# Patient Record
Sex: Male | Born: 1985 | Race: White | Hispanic: No | Marital: Single | Smoking: Former smoker
Health system: Southern US, Community
[De-identification: ages and names within clinical notes are randomized; demographics above are authoritative.]

## PROBLEM LIST (undated history)

## (undated) ENCOUNTER — Ambulatory Visit (HOSPITAL_COMMUNITY): Admission: EM | Payer: BC Managed Care – PPO | Source: Home / Self Care

## (undated) DIAGNOSIS — F419 Anxiety disorder, unspecified: Secondary | ICD-10-CM

## (undated) DIAGNOSIS — F32A Depression, unspecified: Secondary | ICD-10-CM

## (undated) DIAGNOSIS — F4310 Post-traumatic stress disorder, unspecified: Secondary | ICD-10-CM

## (undated) HISTORY — DX: Anxiety disorder, unspecified: F41.9

## (undated) HISTORY — DX: Post-traumatic stress disorder, unspecified: F43.10

## (undated) HISTORY — PX: HEMILAMINOTOMY LUMBAR SPINE: SUR654

## (undated) HISTORY — DX: Depression, unspecified: F32.A

## (undated) HISTORY — PX: SPINE SURGERY: SHX786

---

## 2017-05-24 ENCOUNTER — Emergency Department (HOSPITAL_COMMUNITY): Payer: 59

## 2017-05-24 ENCOUNTER — Encounter (HOSPITAL_COMMUNITY): Payer: Self-pay

## 2017-05-24 ENCOUNTER — Emergency Department (HOSPITAL_COMMUNITY)
Admission: EM | Admit: 2017-05-24 | Discharge: 2017-05-24 | Disposition: A | Discharge status: Home / Self Care | Payer: 59 | Attending: Emergency Medicine | Admitting: Emergency Medicine

## 2017-05-24 DIAGNOSIS — R55 Syncope and collapse: Secondary | ICD-10-CM | POA: Insufficient documentation

## 2017-05-24 LAB — CBC WITH DIFFERENTIAL/PLATELET
Basophils Absolute: 0.1 10*3/uL (ref 0.0–0.1)
Basophils Relative: 1 %
EOS ABS: 0.4 10*3/uL (ref 0.0–0.7)
EOS PCT: 5 %
HCT: 45.2 % (ref 39.0–52.0)
Hemoglobin: 15.3 g/dL (ref 13.0–17.0)
LYMPHS ABS: 3.6 10*3/uL (ref 0.7–4.0)
Lymphocytes Relative: 43 %
MCH: 28.9 pg (ref 26.0–34.0)
MCHC: 33.8 g/dL (ref 30.0–36.0)
MCV: 85.4 fL (ref 78.0–100.0)
MONO ABS: 0.5 10*3/uL (ref 0.1–1.0)
Monocytes Relative: 7 %
Neutro Abs: 3.8 10*3/uL (ref 1.7–7.7)
Neutrophils Relative %: 44 %
PLATELETS: 233 10*3/uL (ref 150–400)
RBC: 5.29 MIL/uL (ref 4.22–5.81)
RDW: 12.8 % (ref 11.5–15.5)
WBC: 8.3 10*3/uL (ref 4.0–10.5)

## 2017-05-24 LAB — BASIC METABOLIC PANEL
Anion gap: 11 (ref 5–15)
BUN: 12 mg/dL (ref 6–20)
CALCIUM: 8.8 mg/dL — AB (ref 8.9–10.3)
CO2: 21 mmol/L — ABNORMAL LOW (ref 22–32)
CREATININE: 0.99 mg/dL (ref 0.61–1.24)
Chloride: 105 mmol/L (ref 101–111)
GFR calc Af Amer: >60 mL/min (ref >60)
Glucose, Bld: 124 mg/dL — ABNORMAL HIGH (ref 65–99)
POTASSIUM: 3.3 mmol/L — AB (ref 3.5–5.1)
SODIUM: 137 mmol/L (ref 135–145)

## 2017-05-24 MED ORDER — SODIUM CHLORIDE 0.9 % IV BOLUS (SEPSIS)
1000.0000 mL | Freq: Once | INTRAVENOUS | Status: AC
  Administered 2017-05-24: 1000 mL via INTRAVENOUS

## 2017-05-24 MED ORDER — POTASSIUM CHLORIDE CRYS ER 20 MEQ PO TBCR: 40.0000 meq | EXTENDED_RELEASE_TABLET | Freq: Once | ORAL | Status: DC

## 2017-05-24 MED ORDER — ONDANSETRON HCL 4 MG/2ML IJ SOLN: 4.0000 mg | Freq: Once | INTRAMUSCULAR | Status: DC

## 2017-05-24 NOTE — ED Notes (Signed)
The pt feels fine

## 2017-05-24 NOTE — Discharge Instructions (Signed)
Your heart was reportedly racing when EMS got there. Follow-up with cardiology for further monitoring.

## 2017-05-24 NOTE — ED Provider Notes (Signed)
Patient feels well. His lab work is unremarkable except for a mildly low potassium which was repleted the ED. Unclear why his bicarbonate is slightly low but his anion gap is normal. He has been given IV fluids. He is feeling back to normal. Unclear cause of syncope, follow-up with cardiology and PCP per prior plan with Dr. Rubin PayorPickering. Discussed return precautions.   [name], Nicholas Goree, MD 05/24/17 [phone]

## 2017-05-24 NOTE — ED Provider Notes (Signed)
[facility]-EMERGENCY DEPT Provider Note   CSN: [id number] Arrival date & time:        History   Chief Complaint Chief Complaint  Patient presents with  . Seizures    HPI Nicholas Wolfe is a 31 y.o. male.  HPI Patient presents after syncope. Was at work and [employer] when he passed out. 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 Does not remember chest pain or palpitations. Apparently woke up rather quickly and was not confused after. Had some shaking with the episode. No loss of bladder bowel control. No tongue biting. No history of syncope. States he didn't sleep all last night but otherwise has been doing well. States he did not drink much liquid today. No chest pain. No trouble breathing. No swelling in his legs. No recent long traveling. Reported initial heart rate of 150. History reviewed. No pertinent past medical history.  There are no active problems to display for this patient.   History reviewed. No pertinent surgical history.     Home Medications    Prior to Admission medications   Not on File    Family History No family history on file.  Social History Social History  Substance Use Topics  . Smoking status: Not on file  . Smokeless tobacco: Not on file  . Alcohol use Not on file     Allergies   Patient has no allergy information on record.   Review of Systems Review of Systems  Constitutional: Negative for chills and fever.  HENT: Negative for ear discharge, ear pain, hearing loss and tinnitus.   Eyes: Negative for photophobia.  Respiratory: Negative for cough.   Cardiovascular: Positive for palpitations. Negative for chest pain.  Gastrointestinal: Negative for abdominal pain, blood in stool, constipation, diarrhea, nausea and vomiting.  Genitourinary: Negative for dysuria, frequency and urgency.  Musculoskeletal: Negative for myalgias and neck pain.  Skin: Negative for rash.  Neurological: Positive  for syncope. Negative for dizziness, tremors and headaches.  Hematological: Does not bruise/bleed easily.  Psychiatric/Behavioral: Negative for confusion.     Physical Exam Updated Vital Signs BP 133/86   Pulse 100   Temp 97.8 F (36.6 C) (Oral)   Resp 12   SpO2 100%   Physical Exam  Constitutional: He appears well-developed.  HENT:  Head: Normocephalic.  Eyes: Pupils are equal, round, and reactive to light.  Neck: Neck supple.  Cardiovascular: Normal rate.   Pulmonary/Chest: Effort normal.  Abdominal: Soft. There is no tenderness.  Musculoskeletal: He exhibits no edema.  Neurological: He is alert.  Skin: Skin is warm. Capillary refill takes less than 2 seconds.  Psychiatric: He has a normal mood and affect.     ED Treatments / Results  Labs (all labs ordered are listed, but only abnormal results are displayed) Labs Reviewed  CBC WITH DIFFERENTIAL/PLATELET  I-STAT CHEM 8, ED    EKG  EKG Interpretation  Date/Time:  Sunday May 24 2017 15:03:50 EDT Ventricular Rate:  96 PR Interval:    QRS Duration: 112 QT Interval:  358 QTC Calculation: 453 R Axis:   69 Text Interpretation:  Sinus rhythm Borderline intraventricular conduction delay Confirmed by Rubin PayorPICKERING  MD, Harrold DonathNATHAN [phone]([id number]) on 05/24/2017 3:24:15 PM       Radiology No results found.  Procedures Procedures (including critical care time)  Medications Ordered in ED Medications  sodium chloride 0.9 % bolus 1,000 mL (1,000 mLs Intravenous New Bag/Given 05/24/17 1520)  ondansetron (ZOFRAN)  injection 4 mg (not administered)     Initial Impression / Assessment and Plan / ED Course  I have reviewed the triage vital signs and the nursing notes.  Pertinent labs & imaging results that were available during my care of the patient were reviewed by me and considered in my medical decision making (see chart for details).     Patient with syncopal episode. Reportedly had tachycardia. Mild hypotension. Does not  appear to be a seizure. EKG overall reassuring. With the tachycardia will likely follow-up with cardiology. Labs pending and care of returned over to Dr. Criss Alvine  Final Clinical Impressions(s) / ED Diagnoses   Final diagnoses:  Syncope, unspecified syncope type    New Prescriptions New Prescriptions   No medications on file     Benjiman Core, MD 05/24/17 1547

## 2017-05-24 NOTE — ED Triage Notes (Signed)
PER EMS: pt was at work ([employer]) when co-workers saw him fall to the ground from a standing position and started shaking for 30 seconds. No history of seizures. EMS arrived and pt was conscious, alert and oriented. Pt was not post-ictal but "irritated" and no urinary incontinence or oral trauma. Denies headache. He verbalizes no recollection of what happened. He states he feels fine but a little lightheaded. He denies new diet changes and no medications. His fiance states he has been struggling with insomnia but pt reports he slept okay last night. Pt A&OX4. BP: 138/96. His initial HR was 150 with EMS but last BP was 105. 98% RA, CBG-116.

## 2017-05-24 NOTE — ED Notes (Signed)
He feels light headed no pain anywhere   Alert and oriented skin warm and d ry.  fsmily at the bedisde

## 2017-05-29 ENCOUNTER — Encounter: Payer: Self-pay | Admitting: Neurology

## 2017-07-01 ENCOUNTER — Encounter: Payer: Self-pay | Admitting: Neurology

## 2017-07-01 ENCOUNTER — Ambulatory Visit (INDEPENDENT_AMBULATORY_CARE_PROVIDER_SITE_OTHER): Payer: 59 | Admitting: Neurology

## 2017-07-01 VITALS — BP 128/66 | HR 88 | Ht 74.0 in | Wt 208.0 lb

## 2017-07-01 DIAGNOSIS — R251 Tremor, unspecified: Secondary | ICD-10-CM

## 2017-07-01 DIAGNOSIS — R55 Syncope and collapse: Secondary | ICD-10-CM

## 2017-07-01 NOTE — Patient Instructions (Addendum)
1. Schedule MRI brain with and without contrast  We have sent a [facility] to [facility] for your [facility] and they will call you directly to schedule your appt. They are located at [address] If you need to contact them directly please call [phone].   2. Schedule 1-hour sleep-deprived EEG 3. Schedule 2-D echocardiogram 4. As per [place] driving laws, no driving after an episode of loss of consciousness, until 6 months event-free 5. Follow-up in 3 months, call for any changes

## 2017-07-01 NOTE — Progress Notes (Signed)
NEUROLOGY CONSULTATION NOTE  Noreene LarssonGiacchino Endicott MRN: [id number] DOB: February 11, 1986  Referring provider: Manon HildingJessica Mauney, PA Primary care provider: Manon HildingJessica Mauney, PA  Reason for consult:  New onset seizure  Thank you for your kind referral of Kieon Hanlon for consultation of the above symptoms. Although his history is well known to you, please allow me to reiterate it for the purpose of our medical record. Records and images were personally reviewed where available.  HISTORY OF PRESENT ILLNESS: Homero FellersFrank is a very pleasant 31 year old right-handed man with a history of anxiety presenting for evaluation of an episode of loss of consciousness last 05/24/2017. He was at work at [employer] and does not recall having any warning symptoms, he woke up to EMS around him. A co-worker reported that when he fell to the ground, he started having "pretty bad" shaking that lasted for 3 minutes. He recalls waking up with his heart beating fast, per EMS 150 bpm. He denied any tongue bite, urinary incontinence, or focal weakness. [facility] where [facility] indicated he woke up rather quickly and was not confused after. He had not been sleeping well for the past 1.5-2 months and only had 2 hours of sleep the night prior to the episode. His bloodwork was overall unremarkable with minimally low K 3.3, calcium 8.8, CO2 21. EKG showed sinus rhythm, borderlinge intraventricular conduction delay. He was discharged home to follow-up with Cardiology.   He denies any prior similar symptoms. He reports gaps in time since childhood, family have told him he would be in "[place]" for 15 seconds. He has had body twitches since he was younger, he used to have them a lot at night, affecting his legs more, but also has small twitches in the daytime. He has had anxiety since his late teens, sometimes feeling nauseated around once every 1-2 months when anxious. He denies any olfactory/gustatory hallucinations, deja vu, focal  numbness/tingling/weakness. He denies any headaches, dizziness, diplopia, dysarthria/dysphagia, neck/back pain, bowel/bladder dysfunction. No palpitations, chest pain, shortness of breath. He has had 2 concussions in his teenage years from skateboarding, hitting his head on the cement with brief loss of consciousness. Otherwise he had a normal birth and early development.  There is no history of febrile convulsions, CNS infections such as meningitis/encephalitis, significant traumatic brain injury, neurosurgical procedures, or family history of seizures.  PAST MEDICAL HISTORY: History reviewed. No pertinent past medical history.  PAST SURGICAL HISTORY: Past Surgical History:  Procedure Laterality Date  . HEMILAMINOTOMY LUMBAR SPINE      MEDICATIONS: No current outpatient prescriptions on file prior to visit.   No current facility-administered medications on file prior to visit.     ALLERGIES: Not on File  FAMILY HISTORY: No family history on file.  SOCIAL HISTORY: Social History   Social History  . Marital status: Single    Spouse name: N/A  . Number of children: N/A  . Years of education: N/A   Occupational History  . Not on file.   Social History Main Topics  . Smoking status: Not on file  . Smokeless tobacco: Not on file  . Alcohol use Not on file  . Drug use: Unknown  . Sexual activity: Not on file   Other Topics Concern  . Not on file   Social History Narrative  . No narrative on file    REVIEW OF SYSTEMS: Constitutional: No fevers, chills, or sweats, no generalized fatigue, change in appetite Eyes: No visual changes, double vision, eye  pain Ear, nose and throat: No hearing loss, ear pain, nasal congestion, sore throat Cardiovascular: No chest pain, palpitations Respiratory:  No shortness of breath at rest or with exertion, wheezes GastrointestinaI: No nausea, vomiting, diarrhea, abdominal pain, fecal incontinence Genitourinary:  No dysuria, urinary  retention or frequency Musculoskeletal:  No neck pain, back pain Integumentary: No rash, pruritus, skin lesions Neurological: as above Psychiatric: No depression, insomnia, +anxiety Endocrine: No palpitations, fatigue, diaphoresis, mood swings, change in appetite, change in weight, increased thirst Hematologic/Lymphatic:  No anemia, purpura, petechiae. Allergic/Immunologic: no itchy/runny eyes, nasal congestion, recent allergic reactions, rashes  PHYSICAL EXAM: Vitals:   07/01/17 0853  BP: 128/66  Pulse: 88  SpO2: 98%   General: No acute distress Head:  Normocephalic/atraumatic Eyes: Fundoscopic exam shows bilateral sharp discs, no vessel changes, exudates, or hemorrhages Neck: supple, no paraspinal tenderness, full range of motion Back: No paraspinal tenderness Heart: regular rate and rhythm Lungs: Clear to auscultation bilaterally. Vascular: No carotid bruits. Skin/Extremities: No rash, no edema Neurological Exam: Mental status: alert and oriented to person, place, and time, no dysarthria or aphasia, Fund of knowledge is appropriate.  Recent and remote memory are intact. 2/3 delayed recall. Attention and concentration are normal.    Able to name objects and repeat phrases. Cranial nerves: CN I: not tested CN II: pupils equal, round and reactive to light, visual fields intact, fundi unremarkable. CN III, IV, VI:  full range of motion, no nystagmus, no ptosis CN V: facial sensation intact CN VII: upper and lower face symmetric CN VIII: hearing intact to finger rub CN IX, X: gag intact, uvula midline CN XI: sternocleidomastoid and trapezius muscles intact CN XII: tongue midline Bulk & Tone: normal, no fasciculations. Motor: 5/5 throughout with no pronator drift. Sensation: intact to light touch, cold, pin, vibration and joint position sense.  No extinction to double simultaneous stimulation.  Romberg test negative Deep Tendon Reflexes: +2 throughout, no ankle clonus Plantar  responses: downgoing bilaterally Cerebellar: no incoordination on finger to nose, heel to shin. No dysdiadochokinesia Gait: narrow-based and steady, able to tandem walk adequately. Tremor: none  IMPRESSION: This is a very pleasant 31 year old right-handed man with a history of anxiety, presenting after an episode of loss of consciousness with report of shaking. Considerations include seizure versus convulsive syncope. He denies any prior warning symptoms. He was reported to have shaking for 3 minutes, which would be atypical for convulsive syncope. He also reports gaps in time/staring spells and body jerks, which can be seen with primary generalized epilepsy. MRI brain with and without contrast and a 1-hour sleep-deprived EEG will be ordered to assess for focal abnormalities that increase risk for recurrent seizures. He was noted to have a heart rate of 150 bpm after the event, and cardiology follow-up was recommended, an echocardiogram will be ordered as part of syncope workup.  [place] driving laws were discussed with the patient, and he knows to stop driving after an episode of loss of consciousness, until 6 months event-free. Follow-up in 3 months, he knows to call for any changes.   Thank you for allowing me to participate in the care of this patient. Please do not hesitate to call for any questions or concerns.   Patrcia Dolly, M.D.  CC: Manon Hilding, [place]

## 2017-07-10 ENCOUNTER — Ambulatory Visit (HOSPITAL_COMMUNITY): Payer: 59 | Attending: Cardiovascular Disease

## 2017-07-10 ENCOUNTER — Other Ambulatory Visit: Payer: Self-pay

## 2017-07-10 DIAGNOSIS — Z87891 Personal history of nicotine dependence: Secondary | ICD-10-CM | POA: Insufficient documentation

## 2017-07-10 DIAGNOSIS — R55 Syncope and collapse: Secondary | ICD-10-CM | POA: Diagnosis not present

## 2017-07-10 DIAGNOSIS — I071 Rheumatic tricuspid insufficiency: Secondary | ICD-10-CM | POA: Diagnosis not present

## 2017-07-10 DIAGNOSIS — R251 Tremor, unspecified: Secondary | ICD-10-CM | POA: Diagnosis present

## 2017-07-13 ENCOUNTER — Telehealth: Payer: Self-pay

## 2017-07-13 ENCOUNTER — Ambulatory Visit (INDEPENDENT_AMBULATORY_CARE_PROVIDER_SITE_OTHER): Payer: 59 | Admitting: Neurology

## 2017-07-13 DIAGNOSIS — R55 Syncope and collapse: Secondary | ICD-10-CM | POA: Diagnosis not present

## 2017-07-13 DIAGNOSIS — R251 Tremor, unspecified: Secondary | ICD-10-CM

## 2017-07-13 NOTE — Telephone Encounter (Signed)
-----   Message from Van Clines, MD sent at 07/10/2017  3:11 PM EDT ----- Pls let him know the echo was normal, heart looks good, thanks

## 2017-07-13 NOTE — Telephone Encounter (Signed)
Spoke with pt, relaying message below. 

## 2017-07-17 NOTE — Procedures (Signed)
ELECTROENCEPHALOGRAM REPORT  Date of Study: 07/13/2017  Patient's Name: Nicholas Wolfe MRN: [id number] Date of Birth: January 27, 1986  Referring Provider: Dr. Patrcia DollyKaren Aquino  Clinical History: This is a 31 year old man with an episode of loss of consciousness with shaking.  Medications: none  Technical Summary: A multichannel digital 1-hour EEG recording measured by the international 10-20 system with electrodes applied with paste and impedances below 5000 ohms performed in our laboratory with EKG monitoring in an awake and drowsy patient.  Hyperventilation and photic stimulation were performed.  The digital EEG was referentially recorded, reformatted, and digitally filtered in a variety of bipolar and referential montages for optimal display.    Description: The patient is awake and drowsy during the recording.  During maximal wakefulness, there is a symmetric, medium voltage 10 Hz posterior dominant rhythm that attenuates with eye opening.  The record is symmetric.  During drowsiness, there is an increase in theta slowing of the background.  Deeper stages of sleep were not seen.  Hyperventilation and photic stimulation did not elicit any abnormalities.  There were no epileptiform discharges or electrographic seizures seen.    EKG lead was unremarkable.  Impression: This 1-hour awake and drowsy EEG is normal.    Clinical Correlation: A normal EEG does not exclude a clinical diagnosis of epilepsy.  If further clinical questions remain, prolonged EEG may be helpful.  Clinical correlation is advised.   Patrcia DollyKaren Aquino, M.D.

## 2017-07-21 ENCOUNTER — Telehealth: Payer: Self-pay

## 2017-07-21 NOTE — Telephone Encounter (Signed)
LMOM relaying message below.  

## 2017-07-21 NOTE — Telephone Encounter (Signed)
-----   Message from Van ClinesKaren M Aquino, MD sent at 07/21/2017 10:04 AM EDT ----- Pls let him know the brain wave test was normal, thanks

## 2017-10-13 ENCOUNTER — Ambulatory Visit: Payer: 59 | Admitting: Neurology

## 2017-12-09 IMAGING — DX DG CHEST 2V
2 series · 2 of 2 positions shown · non-contrast
Comparison: None.

CLINICAL DATA: Syncope

EXAM:
CHEST  2 VIEW

[chest pa]
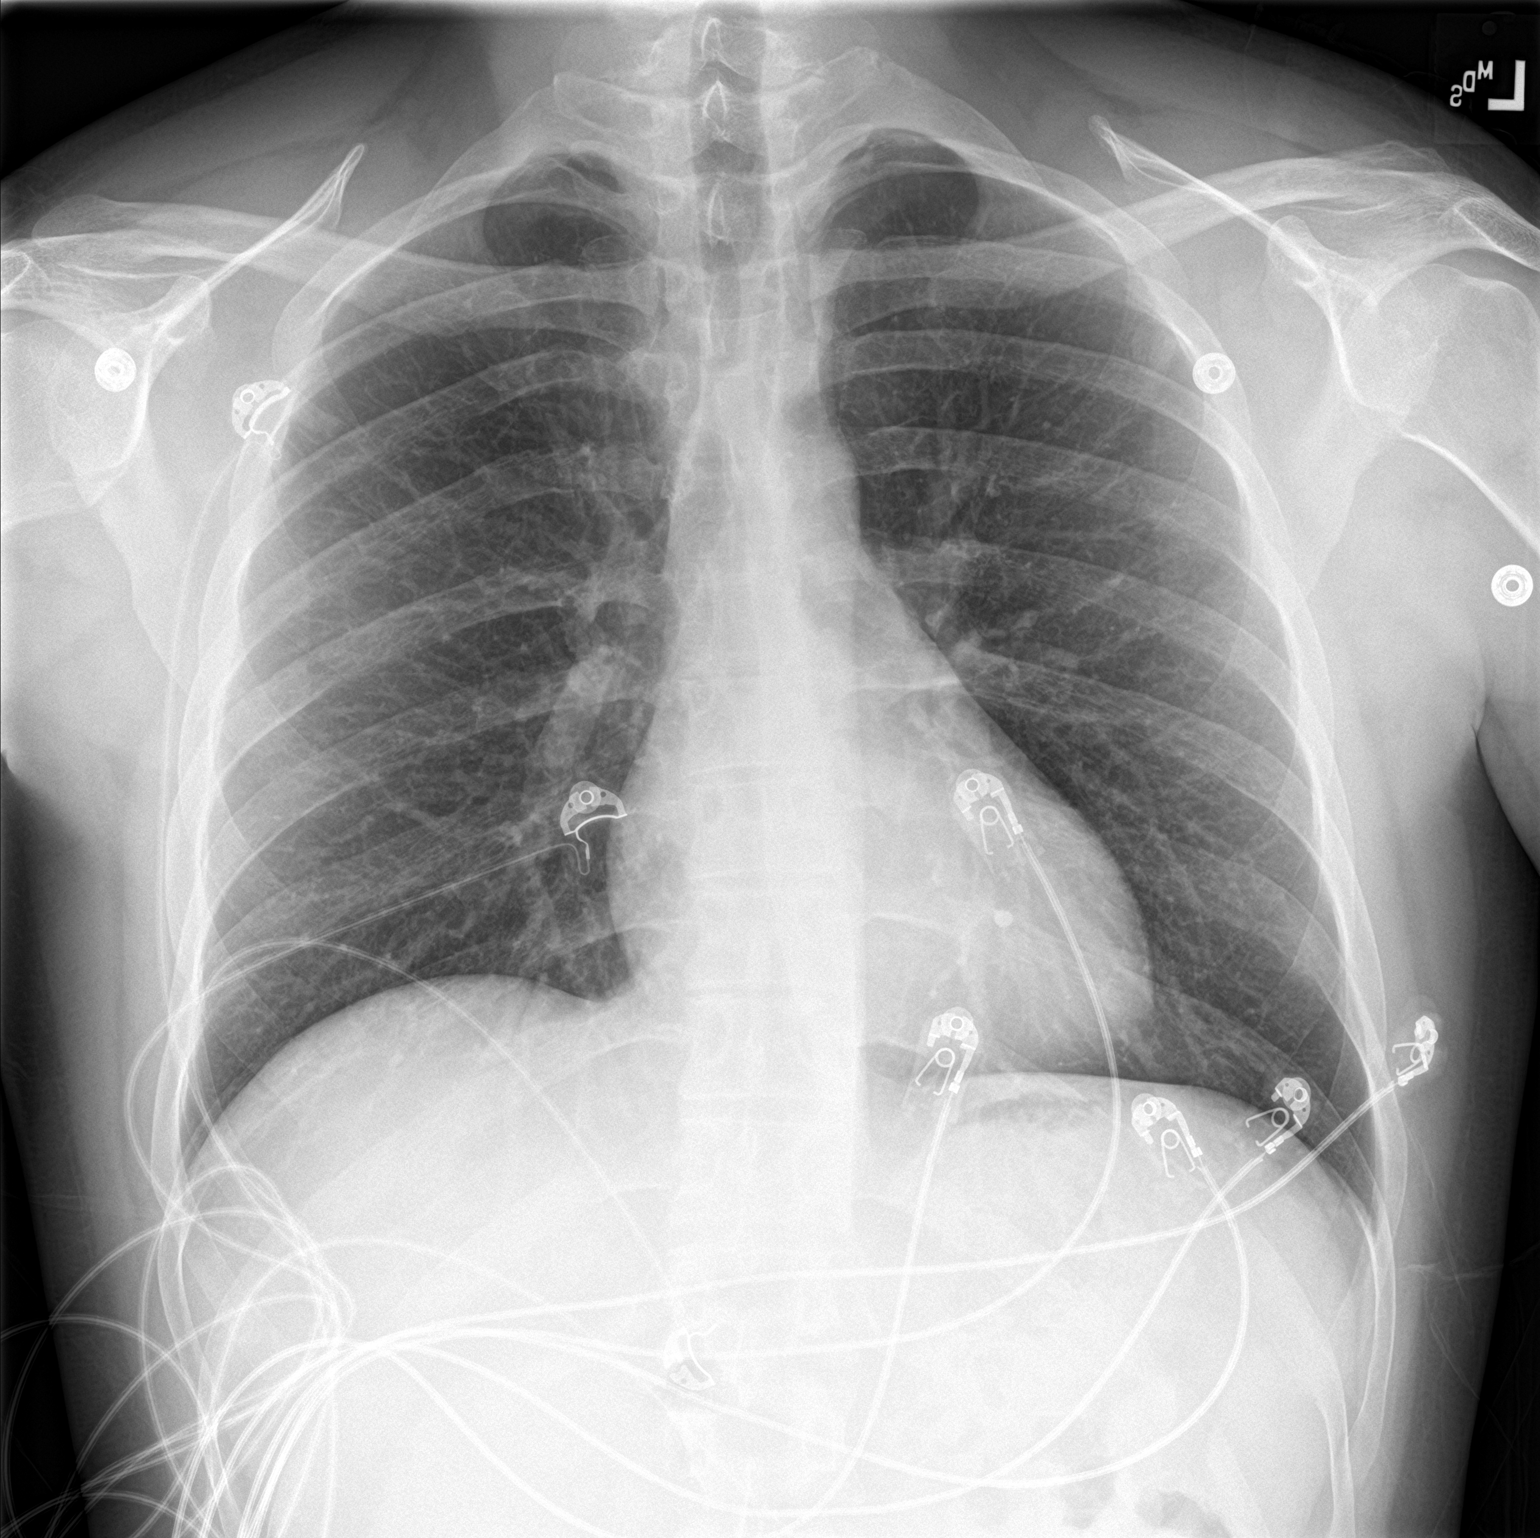

[chest lat]
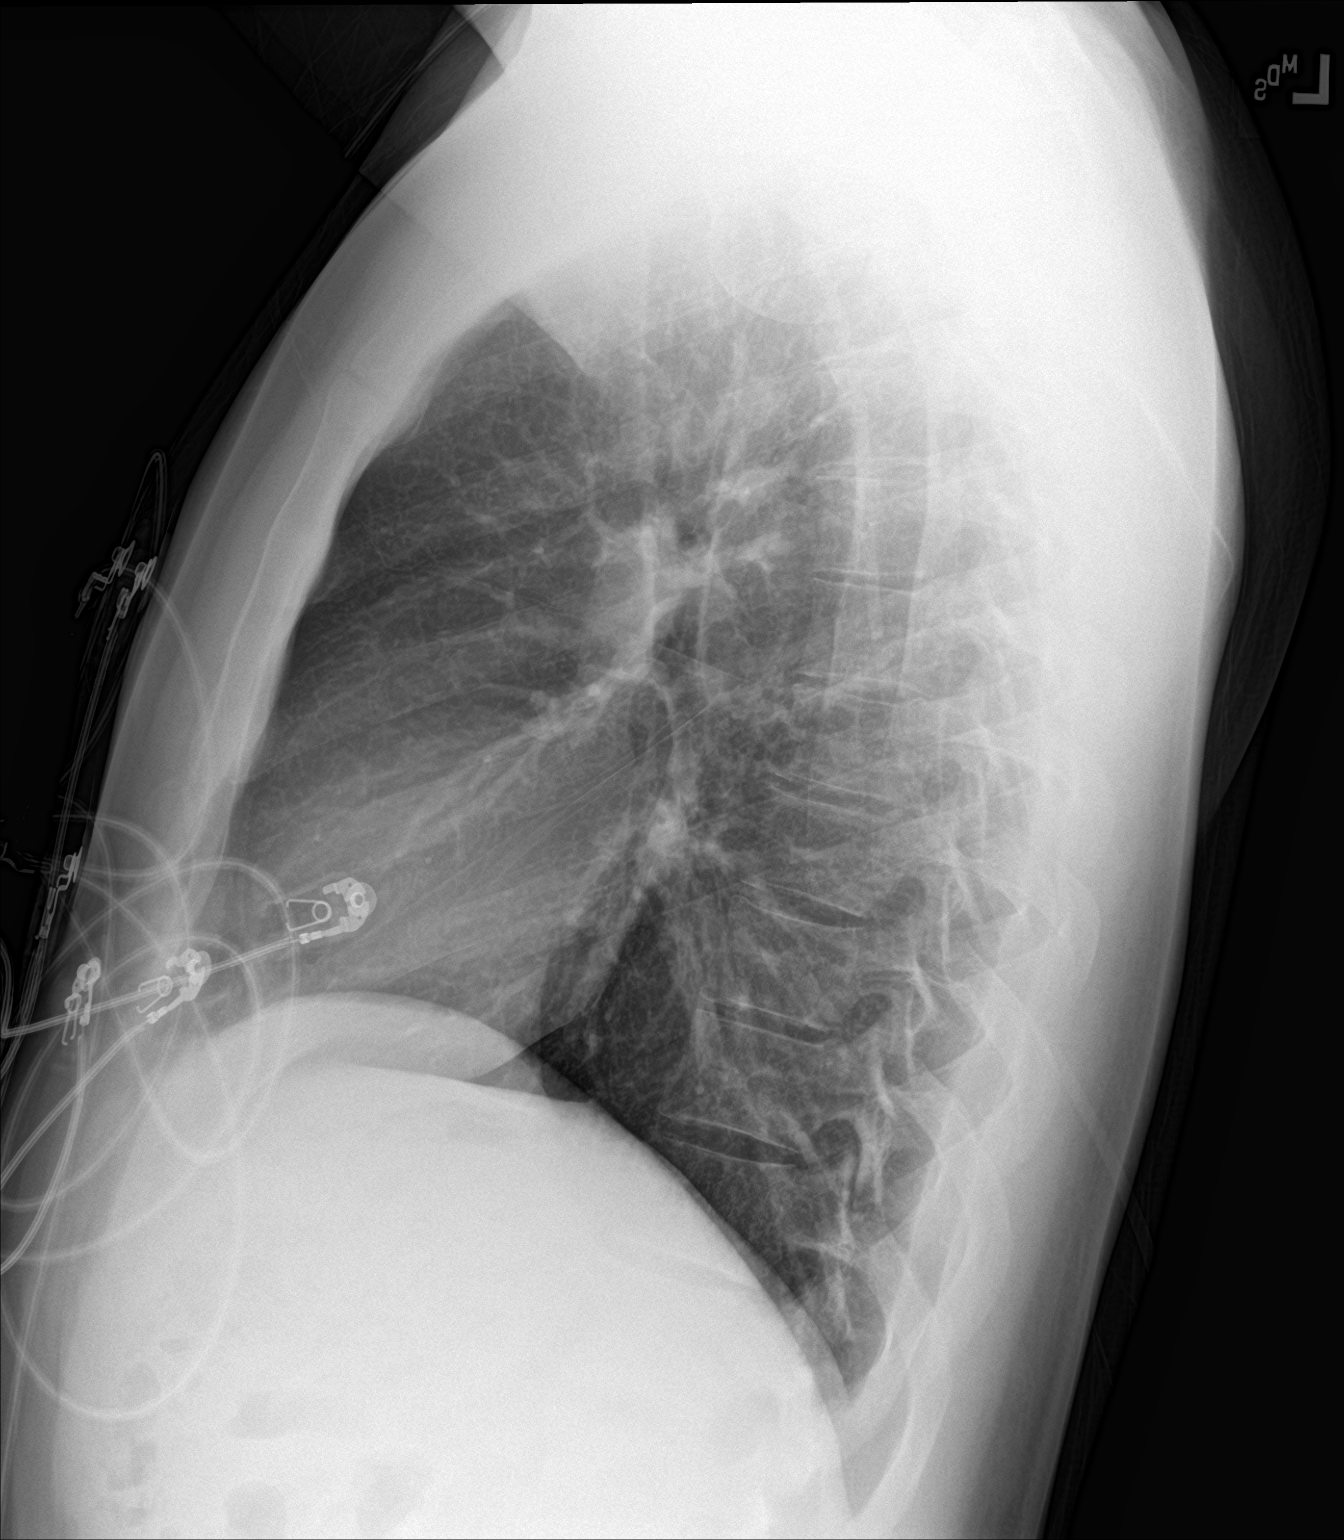

[2 of 2 positions shown; findings below may reference images not displayed]

FINDINGS: Lungs are clear. Heart size and pulmonary vascularity are normal. No
adenopathy. No bone lesions.
IMPRESSION: No edema or consolidation.

## 2021-05-28 ENCOUNTER — Other Ambulatory Visit: Payer: Self-pay

## 2021-05-28 ENCOUNTER — Ambulatory Visit (HOSPITAL_COMMUNITY)
Admission: AD | Admit: 2021-05-28 | Discharge: 2021-05-28 | Disposition: A | Discharge status: Home / Self Care | Payer: BC Managed Care – PPO | Attending: Psychiatry | Admitting: Psychiatry

## 2021-05-28 NOTE — H&P (Signed)
Behavioral Health Medical Screening Exam    Total Time spent with patient: 45 minutes  Nicholas Wolfe is a 35 y.o male, seen by this provider with TTS counselor, presents to [facility] as voluntary walk-in accompanied by no one. Reports "I've been dealing with bad depression and anxiety lately" Reports a source of stress is that he and his wife are getting a divorce and continuing to live together in the same home due to financial issues. Reports frequent panic attacks, approximately  5 per week.   Oriented to person, place, time, and situation. Has good eye contact throughout interview, sitting calmly in exam room. Speech normal, volume normal, thought processes intact. Denies suicidal ideation, without intent or plan. Denies homicidal ideation, denies auditory and visual hallucinations. Recent thoughts of suicide 2 weeks ago after having stressful conversation with his mother. No plan, no intent to die. Able to contract for safety. Does not appear to be responding to external or internal stimuli. Denies past suicide attempts. Denies self injurious  behaviors.   Mood is depressed and anxious with congruent affect. Sleeps 4-5 hours per night, which is normal for patient. Appetite is decreased, "forcing myself to eat".  Denies substance use, endorses using a herbal supplement  that makes him "stoned" and drinks alcohol 2-3 beers per day.   History of depression, anxiety, panic attacks, PTSD. Sees Dr. Evelene Croon, last time last Saturday. Also sees a therapist through his employer.  Currently takes: clonazepam, alprazolam, Depakote. Will start Paxil and stop Depakote. This is a recent change in medications. Lives with wife, Nicholas Wolfe, they are divorcing each other.     Psychiatric Specialty Exam:  Presentation  General Appearance:  Appropriate for Environment Eye Contact: Good Speech: Clear and Coherent Speech Volume: Normal Handedness: Right  Mood and Affect  Mood: Anxious;  Depressed Affect: Congruent  Thought Process  Thought Processes: Goal Directed; Coherent Descriptions of Associations:Intact Orientation:Full (Time, Place and Person) Thought Content:Abstract Reasoning History of Schizophrenia/Schizoaffective disorder:No data recorded Duration of Psychotic Symptoms:No data recorded Hallucinations:Hallucinations: None Ideas of Reference:None Suicidal Thoughts:Suicidal Thoughts: No Homicidal Thoughts:Homicidal Thoughts: No  Sensorium  Memory: Immediate Good; Recent Good; Remote Good Judgment: Good Insight: Good  Executive Functions  Concentration: Good Attention Span: Good Recall: Good Fund of Knowledge: Good Language: Good  Psychomotor Activity  Psychomotor Activity: Psychomotor Activity: Normal  Assets  Assets: Communication Skills; Desire for Improvement; Financial Resources/Insurance; Housing; Physical Health; Vocational/Educational  Sleep  Sleep: Sleep: Fair Number of Hours of Sleep: 5   Physical Exam: Physical Exam Constitutional:      Appearance: Normal appearance.  Cardiovascular:     Rate and Rhythm: Normal rate and regular rhythm.     Heart sounds: Normal heart sounds.  Pulmonary:     Effort: Pulmonary effort is normal.     Breath sounds: Normal breath sounds.  Skin:    General: Skin is warm and dry.  Neurological:     Mental Status: He is alert and oriented to person, place, and time.  Psychiatric:        Attention and Perception: Attention normal.        Mood and Affect: Mood is anxious and depressed.        Speech: Speech normal.        Behavior: Behavior normal. Behavior is cooperative.        Thought Content: Thought content normal. Thought content is not paranoid or delusional. Thought content does not include homicidal or suicidal ideation. Thought content does not include homicidal or suicidal plan.  Cognition and Memory: Cognition normal.     Comments: Suicidal thoughts 2 weeks ago- none  recent   Review of Systems  Constitutional:  Negative for chills and fever.  Respiratory:  Negative for shortness of breath.   Cardiovascular:  Negative for chest pain and palpitations.  Gastrointestinal:  Negative for abdominal pain, nausea and vomiting.  Neurological:  Negative for headaches.  Psychiatric/Behavioral:  Positive for depression. Negative for hallucinations, substance abuse and suicidal ideas. The patient is nervous/anxious and has insomnia (normal for patient).   Blood pressure (!) 139/96, pulse 70, temperature 98.5 F (36.9 C), temperature source Oral, resp. rate 18. There is no height or weight on file to calculate BMI.  Musculoskeletal: Strength & Muscle Tone: within normal limits Gait & Station: normal Patient leans: N/A   Recommendations:  Based on my evaluation the patient does not appear to have an emergency medical condition. Safe for outpatient treatment with resources provided per TTS counselor: Partial hospital program, [facility], and suicide prevention information provided in written form.  Patient agrees with treatment plan.   Novella Olive, NP 05/28/2021, 11:24 AM
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
## 2021-05-30 ENCOUNTER — Telehealth (HOSPITAL_COMMUNITY): Payer: Self-pay | Admitting: Licensed Clinical Social Worker

## 2021-06-17 ENCOUNTER — Telehealth (HOSPITAL_COMMUNITY): Payer: Self-pay | Admitting: Licensed Clinical Social Worker

## 2021-06-20 ENCOUNTER — Other Ambulatory Visit (HOSPITAL_COMMUNITY): Payer: BC Managed Care – PPO | Attending: Psychiatry

## 2021-06-20 ENCOUNTER — Other Ambulatory Visit: Payer: Self-pay

## 2021-06-20 DIAGNOSIS — F4310 Post-traumatic stress disorder, unspecified: Secondary | ICD-10-CM | POA: Insufficient documentation

## 2021-06-20 DIAGNOSIS — R4589 Other symptoms and signs involving emotional state: Secondary | ICD-10-CM | POA: Insufficient documentation

## 2021-06-20 DIAGNOSIS — F332 Major depressive disorder, recurrent severe without psychotic features: Secondary | ICD-10-CM | POA: Insufficient documentation

## 2021-06-20 DIAGNOSIS — R41844 Frontal lobe and executive function deficit: Secondary | ICD-10-CM | POA: Insufficient documentation

## 2021-06-24 ENCOUNTER — Other Ambulatory Visit (HOSPITAL_COMMUNITY): Payer: BC Managed Care – PPO | Admitting: Licensed Clinical Social Worker

## 2021-06-24 ENCOUNTER — Encounter (HOSPITAL_COMMUNITY): Payer: Self-pay

## 2021-06-24 ENCOUNTER — Other Ambulatory Visit: Payer: Self-pay

## 2021-06-24 ENCOUNTER — Other Ambulatory Visit (HOSPITAL_COMMUNITY): Payer: BC Managed Care – PPO | Admitting: Occupational Therapy

## 2021-06-24 DIAGNOSIS — F411 Generalized anxiety disorder: Secondary | ICD-10-CM

## 2021-06-24 DIAGNOSIS — F332 Major depressive disorder, recurrent severe without psychotic features: Secondary | ICD-10-CM | POA: Diagnosis present

## 2021-06-24 DIAGNOSIS — R41844 Frontal lobe and executive function deficit: Secondary | ICD-10-CM

## 2021-06-24 DIAGNOSIS — R4589 Other symptoms and signs involving emotional state: Secondary | ICD-10-CM

## 2021-06-24 DIAGNOSIS — F4310 Post-traumatic stress disorder, unspecified: Secondary | ICD-10-CM | POA: Diagnosis not present

## 2021-06-24 NOTE — Therapy (Signed)
[facility] PARTIAL HOSPITALIZATION PROGRAM [address] [place], [place], [address] Phone: [phone]   Fax:  [phone] Virtual Visit via Video Note  I connected with Nicholas Wolfe on 06/24/21 at  11:00 AM EDT by a video enabled telemedicine application and verified that I am speaking with the correct person using two identifiers.  Location: Patient: Patient Home Provider: Clinic Office   I discussed the limitations of evaluation and management by telemedicine and the availability of in person appointments. The patient expressed understanding and agreed to proceed.   I discussed the assessment and treatment plan with the patient. The patient was provided an opportunity to ask questions and all were answered. The patient agreed with the plan and demonstrated an understanding of the instructions.   The patient was advised to call back or seek an in-person evaluation if the symptoms worsen or if the condition fails to improve as anticipated.  I provided 92 minutes of non-face-to-face time during this encounter. 32 minutes OT Evaluation 60 minutes OT Group  Donne Hazel, OT   Occupational Therapy Evaluation  Patient Details  Name: Nicholas Wolfe MRN: [id number] Date of Birth: [birth year] Referring Provider (OT): Hillery Jacks   Encounter Date: 06/24/2021   OT End of Session - 06/24/21 1313     Visit Number 1    Number of Visits 20    Date for OT Re-Evaluation 07/22/21    Authorization Type BCBS    OT Start Time 1110   OT Eval [phone]   OT Stop Time 1210    OT Time Calculation (min) 60 min    Activity Tolerance Patient tolerated treatment well    Behavior During Therapy Flat affect             Past Medical History:  Diagnosis Date   Anxiety    Depression    PTSD (post-traumatic stress disorder)     Past Surgical History:  Procedure Laterality Date   HEMILAMINOTOMY LUMBAR SPINE     SPINE SURGERY      There were no vitals  filed for this visit.   Subjective Assessment - 06/24/21 1312     Currently in Pain? No/denies               [facility] OT Assessment - 06/24/21 0001       Assessment   Medical Diagnosis Major Depression    Referring Provider (OT) Hillery Jacks      Precautions   Precautions None      Balance Screen   Has the patient fallen in the past 6 months No    Has the patient had a decrease in activity level because of a fear of falling?  No    Is the patient reluctant to leave their home because of a fear of falling?  No             OT Education - 06/24/21 1312     Education Details Educated on OT role within PHP in addition to self-care and provided tips/strategies to improving specific areas of self-care as it relates to one's mental health    Person(s) Educated Patient    Methods Explanation;Handout    Comprehension Verbalized understanding              OT Short Term Goals - 06/24/21 1314       OT SHORT TERM GOAL #1   Title Pt will actively engage in OT group sessions throughout duration of PHP programming, in order to  promote daily structure, social engagement, and opportunities to develop and utilize adaptive strategies to maximize functional performance in preparation for safe transition and integration back into school, work, and the community.    Time 4    Period Weeks    Status New    Target Date 07/22/21      OT SHORT TERM GOAL #2   Title Pt will demonstrate improved ability to communicate feelings/needs/wants, without being angry/irritable/aggressive, as evidenced by, active participation in OT sessions, throughout duration of PHP programming, in order to safely transition back into the community at discharge.    Time 4    Period Weeks    Status New    Target Date 07/22/21      OT SHORT TERM GOAL #3   Title Pt will identify 1-3 strategies to increase social participation, in order to promote healthy socialization and community reintegration, in preparation  for discharge.    Time 4    Period Weeks    Status New    Target Date 07/22/21      OT SHORT TERM GOAL #4   Title Pt will identify and implement 1-3 sleep hygiene strategies he can utilize, in order to improve sleep quality/ADL performance, in preparation for safe and healthy reintegration back into the community at discharge.    Time 4    Period Weeks    Status New    Target Date 07/22/21           Occupational Therapy Assessment 06/24/2021  Nicholas "Nicholas Fellers" is a 35 y/o male with PMHx of depression and anxiety who was referred to the [facility] program as a walk-in from [facility] with reports of worsening depression and anxiety over the last 3 months in the context of his wife asking for a divorce. Pt and ex-wife are currently living together with a plan to sell the house in October, reports this is an added stressors. Pt also reports financial difficulties, reportedly having 0.14 in his bank account. Pt also reports being on leave from work, but "assuming" he is fired, as he has not showed up in the last two months. Pt reports difficulty engaging in ADL/iADLs, not having showered or changed his clothes over a 3 week period. Pt reports a desire to engage in PHP programming in order to manage identified stressors and to engage meaningfully in identified areas of occupation and ADL/iADLs. Upon approach, pt presents as cooperative and forthcoming with information, though notably depressed with flat affect. Pt reports enjoying kayaking, watching TV, bass fishing, and playing video games.  Precautions/Limitations: None noted/observed  Cognition: WFL   Visual Motor: WFL   Living Situation: Pt lives in a house with his ex-wife; currently going through a divorce  School/Work: Pt works FT as a Biomedical scientist for [employer] - has not been to work in 2 months and reports he is 'probably' fired  ADL/iADL Performance: Reports difficulty engaging in ADL/iADLs; 3 week period where he did not shower/change his  clothes; reports ongoing difficulty with sleep, hygiene, and appetite   Leisure Interests and Hobbies: Enjoys kayaking, video games, watching TV, bass fishing  Social Support: Mom and sister (both in [place]) ; reports no friends here in [place]   What do you do when you are very stressed, angry, upset, sad or anxious? Isolate from others, Use drugs/alcohol, Yell/Scream, Cry, and Act out   What helps when you are not feeling well? Deep breathing, Watching TV, and Pacing the hall  What are some things that  make it MORE difficult for you when you are already upset? Not having choices/input, Noise (in general), Bedroom door being open, Not being able to express my opinion, People staring at me, and Being criticized  Is there anything specific that you would like help with while you're in the partial hospitalization program? Coping Skills, Anger Management, Impulse Control, Relationships, Stress Management, Goal-setting, Sleep, and Self-esteem   What is your goal while you are here?  None noted*  Assessment: Pt demonstrates behavior that inhibits/restricts participation in occupation and would benefit from skilled occupational therapy services to address current difficulties with symptom management, emotion regulation, socialization, stress management, time management, job readiness, financial wellness, health and nutrition, sleep hygiene, ADL/iADL performance and leisure participation, in preparation for reintegration and return to community at discharge.   Plan: Pt will participate in skilled occupational therapy sessions (group and/or individual) in order to promote daily structure, social engagement, and opportunities to develop and utilize adaptive strategies to maximize functional performance in preparation for safe transition and integration back into school, work, and/or the community at discharge. OT sessions will occur 4-5 x per week for 2-4 weeks.   Donne Hazelassidy Jaquell Seddon, MOT, OTR/L  Group  Session:  S: "I think I know about eating balanced meals, but right now I am struggling with not having much of an appetite to eat anything."  O: Today's group session focused on the topic of self-care and group member completed a self-care assessment that identified specific categories within self-care that needed improvement, including physical, emotional/psychological, social, spiritual, and professional. Discussion focused on identifying which areas need the most work/improvement and brainstormed strategies and tips to improve self-care. Group members were also encouraged to identify strengths and areas of improvement.     A:  Nicholas Wolfe was active in his participation of discussion and activity, sharing that self-care is engaging in activities that he enjoys, such as video games and kayaking. Pt identified several areas/activities of self-care strength including eating balanced meals, engaging in hygiene, getting away from distractions, finding reasons to laugh, and talking about his problems. Pt identified areas of improvement as having low appetite, exercise, getting more sleep, engaging in fun activities, going to medical appointments, engaging in hobbies, and overall social self-care. Pt receptive to feedback, education, and support offered during session.     P: Continue to attend PHP OT group sessions 5x week for 4 weeks to promote daily structure, social engagement, and opportunities to develop and utilize adaptive strategies to maximize functional performance in preparation for safe transition and integration back into school, work, and the community. Plan to address topic of stress management in next OT group session.  Plan - 06/24/21 1313     Clinical Impression Statement Osamu "Nicholas Wolfe" is a 35 y/o male with PMHx of depression and anxiety [facility] with reports of worsening depression and anxiety over the last 3 months in the context of his wife  asking for a divorce. Pt and ex-wife are currently living together with a plan to sell the house in October, reports this is an added stressors. Pt also reports financial difficulties, reportedly having 0.14 in his bank account. Pt also reports being on leave from work, but "assuming" he is fired, as he has not showed up in the last two months. Pt reports difficulty engaging in ADL/iADLs, not having showered or changed his clothes over a 3 week period. [facility]  order to manage identified stressors and to engage meaningfully in identified areas of occupation and ADL/iADLs.    OT Occupational Profile and History Problem Focused Assessment - Including review of records relating to presenting problem    Occupational performance deficits (Please refer to evaluation for details): ADL's;IADL's;Rest and Sleep;Work;Leisure;Social Participation    Body Structure / Function / Physical Skills ADL    Cognitive Skills Attention;Emotional;Energy/Drive;Learn;Memory;Perception;Problem Solve;Safety Awareness;Temperament/Personality;Thought;Understand    Psychosocial Skills Coping Strategies;Environmental  Adaptations;Habits;Interpersonal Interaction;Routines and Behaviors    Rehab Potential Good    Clinical Decision Making Limited treatment options, no task modification necessary    Comorbidities Affecting Occupational Performance: May have comorbidities impacting occupational performance    Modification or Assistance to Complete Evaluation  No modification of tasks or assist necessary to complete eval    OT Frequency 5x / week    OT Duration 4 weeks    OT Treatment/Interventions Self-care/ADL training;Patient/family education;Coping strategies training;Psychosocial skills training    Consulted and Agree with Plan of Care Patient             Patient will benefit from skilled therapeutic intervention in order to improve the following deficits and impairments:   Body  Structure / Function / Physical Skills: ADL Cognitive Skills: Attention, Emotional, Energy/Drive, Learn, Memory, Perception, Problem Solve, Safety Awareness, Temperament/Personality, Thought, Understand Psychosocial Skills: Coping Strategies, Environmental  Adaptations, Habits, Interpersonal Interaction, Routines and Behaviors   Visit Diagnosis: Difficulty coping  Frontal lobe and executive function deficit  Severe episode of recurrent major depressive disorder, without psychotic features (HCC)    Problem List There are no problems to display for this patient.   06/24/2021  Donne Hazel, MOT, OTR/L  06/24/2021, 1:16 PM  [facility] HOSPITALIZATION PROGRAM [address] [place], [place], [address] Phone: [phone]   Fax:  [phone]  Name: Aasir Daigler MRN: [id number] Date of Birth: 12-26-1985

## 2021-06-24 NOTE — Progress Notes (Signed)
Spoke with patient via Webex video call, used 2 identifiers to correctly identify patient. This is his first time in [facility] after going to [facility] for an evaluation he was recommended for the program. Dealing with depression and anxiety, panic attacks daily. Was having suicidal thoughts when he went for evaluation but no plan or intent and knew he needed help. Has never attempted suicide. Has extreme mental fatigue. His wife has filed for divorce 3 months ago but they continue to live together due to financial constraints. He would like to work things out but she is unwilling. He recently lost his job due to absences surrounding his mental health. Was suppose to switch from Depakote to Paxil but can't afford the medication. States it cost $60 monthly and he has change in his account. Denies SI/HI or AV hallucinations. On scale 1-10 as 10 being worst he rates depression at 7 and anxiety at 8. PHQ9=22. No other issues or complaints.

## 2021-06-25 ENCOUNTER — Other Ambulatory Visit (HOSPITAL_COMMUNITY): Payer: BC Managed Care – PPO

## 2021-06-25 ENCOUNTER — Other Ambulatory Visit: Payer: Self-pay

## 2021-06-25 ENCOUNTER — Telehealth (HOSPITAL_COMMUNITY): Payer: Self-pay | Admitting: Licensed Clinical Social Worker

## 2021-06-26 ENCOUNTER — Other Ambulatory Visit (HOSPITAL_COMMUNITY): Payer: BC Managed Care – PPO

## 2021-06-26 ENCOUNTER — Telehealth (HOSPITAL_COMMUNITY): Payer: Self-pay | Admitting: Licensed Clinical Social Worker

## 2021-06-26 ENCOUNTER — Other Ambulatory Visit: Payer: Self-pay

## 2021-06-27 ENCOUNTER — Other Ambulatory Visit: Payer: Self-pay

## 2021-06-27 ENCOUNTER — Other Ambulatory Visit (HOSPITAL_COMMUNITY): Payer: BC Managed Care – PPO | Admitting: Licensed Clinical Social Worker

## 2021-06-27 ENCOUNTER — Other Ambulatory Visit (HOSPITAL_COMMUNITY): Payer: BC Managed Care – PPO | Admitting: Occupational Therapy

## 2021-06-27 DIAGNOSIS — F332 Major depressive disorder, recurrent severe without psychotic features: Secondary | ICD-10-CM

## 2021-06-27 DIAGNOSIS — R41844 Frontal lobe and executive function deficit: Secondary | ICD-10-CM

## 2021-06-27 DIAGNOSIS — F411 Generalized anxiety disorder: Secondary | ICD-10-CM

## 2021-06-27 DIAGNOSIS — R4589 Other symptoms and signs involving emotional state: Secondary | ICD-10-CM

## 2021-06-28 ENCOUNTER — Encounter (HOSPITAL_COMMUNITY): Payer: Self-pay

## 2021-06-28 ENCOUNTER — Other Ambulatory Visit: Payer: Self-pay

## 2021-06-28 ENCOUNTER — Other Ambulatory Visit (HOSPITAL_COMMUNITY): Payer: BC Managed Care – PPO | Admitting: Licensed Clinical Social Worker

## 2021-06-28 ENCOUNTER — Other Ambulatory Visit (HOSPITAL_COMMUNITY): Payer: BC Managed Care – PPO | Admitting: Occupational Therapy

## 2021-06-28 DIAGNOSIS — R4589 Other symptoms and signs involving emotional state: Secondary | ICD-10-CM | POA: Diagnosis not present

## 2021-06-28 DIAGNOSIS — F332 Major depressive disorder, recurrent severe without psychotic features: Secondary | ICD-10-CM

## 2021-06-28 DIAGNOSIS — F411 Generalized anxiety disorder: Secondary | ICD-10-CM

## 2021-06-28 DIAGNOSIS — R41844 Frontal lobe and executive function deficit: Secondary | ICD-10-CM

## 2021-06-28 NOTE — Therapy (Signed)
[facility] [place], [place], [address] Phone: [phone]   Fax:  [phone] Virtual Visit via Video Note  I connected with Nicholas Wolfe on 06/27/21 at  12:00 PM EDT by a video enabled telemedicine application and verified that I am speaking with the correct person using two identifiers.  Location: Patient: Patient Home Provider: Clinic Office    I discussed the limitations of evaluation and management by telemedicine and the availability of in person appointments. The patient expressed understanding and agreed to proceed.   I discussed the assessment and treatment plan with the patient. The patient was provided an opportunity to ask questions and all were answered. The patient agreed with the plan and demonstrated an understanding of the instructions.   The patient was advised to call back or seek an in-person evaluation if the symptoms worsen or if the condition fails to improve as anticipated.  I provided 50 minutes of non-face-to-face time during this encounter.   Ponciano Ort, OT   Occupational Therapy Treatment  Patient Details  Name: Nicholas Wolfe MRN: [id number] Date of Birth: [birth year] Referring Provider (OT): Ricky Ala   Encounter Date: 06/27/2021   OT End of Session - 06/28/21 0754     Visit Number 2    Number of Visits 20    Date for OT Re-Evaluation 07/22/21    Authorization Type BCBS Deduct [id number] 0 met   Co Ins 30%   OOP [phone] met   Visit based on medical necessity Visit limit 30- 0 used PT/OT/ ST combined   S/w Marga Melnick [phone]    Authorization - Number of Visits 30    OT Start Time 1200    OT Stop Time 1250    OT Time Calculation (min) 50 min    Activity Tolerance Patient tolerated treatment well    Behavior During Therapy WFL for tasks assessed/performed             Past Medical History:  Diagnosis Date   Anxiety    Depression    PTSD  (post-traumatic stress disorder)     Past Surgical History:  Procedure Laterality Date   HEMILAMINOTOMY LUMBAR SPINE     SPINE SURGERY      There were no vitals filed for this visit.   Subjective Assessment - 06/28/21 0753     Currently in Pain? No/denies                                  OT Education - 06/28/21 0753     Education Details Educated on different communication styles and identified strategies/tips to practice being more assertive    Person(s) Educated Patient    Methods Explanation;Handout    Comprehension Verbalized understanding              OT Short Term Goals - 06/28/21 0755       OT SHORT TERM GOAL #1   Status On-going      OT SHORT TERM GOAL #2   Status On-going      OT SHORT TERM GOAL #3   Status On-going      OT SHORT TERM GOAL #4   Status On-going           Group Session:  S: "I struggle with other people misinterpreting my communication. I'm a loud 48'2" male and people, in general, find that intimidating, even if I am  not upset."  O: Today's group focused on topic of Communication Styles. Group members were educated on the different styles including passive, aggressive, and assertive communication. Members shared and reflected on which style they most often find themselves communicating in and how to transition to a more assertive approach.   A: [name] was active and independent in his participation of discussion and shared how he struggles with others misinterpreting his communication, noting that people often tell him he is intimidating. Pt shared that this makes it difficult for him to be assertive and speak up for himself at times because other people feel "attacked" and report his loud nature as "aggressive." Pt appeared open to feedback and additional education reviewing communication styles/tactics.   P: Continue to attend PHP OT group sessions 5x week for 3 weeks to promote daily structure, social  engagement, and opportunities to develop and utilize adaptive strategies to maximize functional performance in preparation for safe transition and integration back into school, work, and the community. Plan to address topic of assertiveness in next OT group session.  Plan - 06/28/21 0755     Occupational performance deficits (Please refer to evaluation for details): ADL's;IADL's;Rest and Sleep;Work;Leisure;Social Participation    Body Structure / Function / Physical Skills ADL    Cognitive Skills Attention;Emotional;Energy/Drive;Learn;Memory;Perception;Problem Solve;Safety Awareness;Temperament/Personality;Thought;Understand    Psychosocial Skills Coping Strategies;Environmental  Adaptations;Habits;Interpersonal Interaction;Routines and Behaviors             Patient will benefit from skilled therapeutic intervention in order to improve the following deficits and impairments:   Body Structure / Function / Physical Skills: ADL Cognitive Skills: Attention, Emotional, Energy/Drive, Learn, Memory, Perception, Problem Solve, Safety Awareness, Temperament/Personality, Thought, Understand Psychosocial Skills: Coping Strategies, Environmental  Adaptations, Habits, Interpersonal Interaction, Routines and Behaviors   Visit Diagnosis: Difficulty coping  Frontal lobe and executive function deficit  Severe episode of recurrent major depressive disorder, without psychotic features ([place])    Problem List There are no problems to display for this patient.   06/28/2021  Ponciano Ort, MOT, OTR/L  06/28/2021, 7:56 AM  [facility] [place], [place], [address] Phone: [phone]   Fax:  [phone]  Name: Nicholas Wolfe MRN: [id number] Date of Birth: 07/19/1986

## 2021-06-28 NOTE — Therapy (Addendum)
[place], [place], [address] Phone: [phone]   Fax:  [phone] Virtual Visit via Video Note  I connected with Nicholas Wolfe on 06/28/21 at  12:00 PM EDT by a video enabled telemedicine application and verified that I am speaking with the correct person using two identifiers.  Location: Patient: Patient Home Provider: Clinic Office   I discussed the limitations of evaluation and management by telemedicine and the availability of in person appointments. The patient expressed understanding and agreed to proceed.  I discussed the assessment and treatment plan with the patient. The patient was provided an opportunity to ask questions and all were answered. The patient agreed with the plan and demonstrated an understanding of the instructions.   The patient was advised to call back or seek an in-person evaluation if the symptoms worsen or if the condition fails to improve as anticipated.  I provided 50 minutes of non-face-to-face time during this encounter.   Nicholas Wolfe, OT   Occupational Therapy Treatment  Patient Details  Name: Nicholas Wolfe MRN: [id number] Date of Birth: 25-May-1986 Referring Provider (OT): Nicholas Wolfe   Encounter Date: 06/28/2021   OT End of Session - 06/28/21 1451     Visit Number 3    Number of Visits 20    Date for OT Re-Evaluation 07/22/21    Authorization Type BCBS Deduct 3500- 0 met   Co Ins 30%   OOP 5945-859.29 met   Visit based on medical necessity Visit limit 30- 0 used PT/OT/ ST combined   S/w Nicholas Wolfe [phone]    Authorization - Number of Visits 30    OT Start Time 1200    OT Stop Time 1250    OT Time Calculation (min) 50 min    Activity Tolerance Patient tolerated treatment well    Behavior During Therapy WFL for tasks assessed/performed             Past Medical History:  Diagnosis Date   Anxiety    Depression    PTSD  (post-traumatic stress disorder)     Past Surgical History:  Procedure Laterality Date   HEMILAMINOTOMY LUMBAR SPINE     SPINE SURGERY      There were no vitals filed for this visit.   Subjective Assessment - 06/28/21 1451     Currently in Pain? No/denies                                  OT Education - 06/28/21 1451     Education Details Educated on different communication styles with strategies to become more assertive with use of XYZ communication tool    Person(s) Educated Patient    Methods Explanation;Handout    Comprehension Verbalized understanding              OT Short Term Goals - 06/28/21 0755       OT SHORT TERM GOAL #1   Status On-going      OT SHORT TERM GOAL #2   Status On-going      OT SHORT TERM GOAL #3   Status On-going      OT SHORT TERM GOAL #4   Status On-going           Group Session:  S: "My ex wife is a passive aggressive communicator and that sets me off every time to become aggressive."  O: Group began  with a reflection from previous OT session focused on communication styles and group members re-iterated what was learned during previous session. Members shared and reflected on any opportunities they were presented with last evening to practice their assertiveness skills or recognize patterns of communication observed. Today's group focused on assertiveness skills training and use of the XYZ* assertive communication tool was introduced. The XYZ communication tool states: I feel X when you do Y in situation Z and I would like _________. X is the emotion, Y is the specific behavior, and Z is the specific situation. Group members each formulated their own XYZ statement and shared with the group to discuss and offer feedback. Additional tips and strategies to practice being assertive were also introduced and discussed.   A: Nicholas Wolfe was active and independent in his participation of discussion and activity, sharing that  he has the most difficulty conversing with his ex-wife, as she is a passive aggressive communicator and he is aggressive. Pt appeared open and receptive to use of XYZ formula and expressed interest in utilizing assertive tips reviewed in the future.  P: Continue to attend PHP OT group sessions 5x week for 4 weeks to promote daily structure, social engagement, and opportunities to develop and utilize adaptive strategies to maximize functional performance in preparation for safe transition and integration back into school, work, and the community. Plan to address topic of fair fighting in next OT group session.  OCCUPATIONAL THERAPY DISCHARGE SUMMARY  Visits from Start of Care: 3  Current functional level related to goals / functional outcomes: Nicholas Wolfe has not achieved any of his OT goals within the [facility] program and is being discharged at this time due to not returning since the last visit. Recommend patient continue with PCP/OP provider for ongoing therapy and treatment needs.    Remaining deficits: See above    Education / Equipment: See above    Patient agrees to discharge. Patient goals were not met. Patient is being discharged due to not returning since the last visit..     Plan - 06/28/21 1454     Occupational performance deficits (Please refer to evaluation for details): ADL's;IADL's;Rest and Sleep;Work;Leisure;Social Participation    Body Structure / Function / Physical Skills ADL    Cognitive Skills Attention;Emotional;Energy/Drive;Learn;Memory;Perception;Problem Solve;Safety Awareness;Temperament/Personality;Thought;Understand    Psychosocial Skills Coping Strategies;Environmental  Adaptations;Habits;Interpersonal Interaction;Routines and Behaviors             Patient will benefit from skilled therapeutic intervention in order to improve the following deficits and impairments:   Body Structure / Function / Physical Skills: ADL Cognitive Skills: Attention, Emotional,  Energy/Drive, Learn, Memory, Perception, Problem Solve, Safety Awareness, Temperament/Personality, Thought, Understand Psychosocial Skills: Coping Strategies, Environmental  Adaptations, Habits, Interpersonal Interaction, Routines and Behaviors   Visit Diagnosis: Difficulty coping  Frontal lobe and executive function deficit  Severe episode of recurrent major depressive disorder, without psychotic features ([place])    Problem List There are no problems to display for this patient.   06/28/2021  Nicholas Wolfe, MOT, OTR/L  06/28/2021, 2:54 PM  [facility] HOSPITALIZATION PROGRAM [place], [place], [address] Phone: [phone]   Fax:  [phone]  Name: Venancio Chenier MRN: [id number] Date of Birth: 08/06/1986

## 2021-07-01 ENCOUNTER — Other Ambulatory Visit (HOSPITAL_COMMUNITY): Payer: BC Managed Care – PPO

## 2021-07-01 ENCOUNTER — Other Ambulatory Visit: Payer: Self-pay

## 2021-07-01 ENCOUNTER — Telehealth (HOSPITAL_COMMUNITY): Payer: Self-pay | Admitting: Licensed Clinical Social Worker

## 2021-07-02 ENCOUNTER — Telehealth (HOSPITAL_COMMUNITY): Payer: Self-pay | Admitting: Licensed Clinical Social Worker

## 2021-07-02 ENCOUNTER — Other Ambulatory Visit: Payer: Self-pay

## 2021-07-02 ENCOUNTER — Other Ambulatory Visit (HOSPITAL_COMMUNITY): Payer: BC Managed Care – PPO

## 2021-07-03 ENCOUNTER — Other Ambulatory Visit: Payer: Self-pay

## 2021-07-03 ENCOUNTER — Other Ambulatory Visit (HOSPITAL_COMMUNITY): Payer: BC Managed Care – PPO

## 2021-07-03 ENCOUNTER — Telehealth (HOSPITAL_COMMUNITY): Payer: Self-pay | Admitting: Licensed Clinical Social Worker

## 2021-07-04 ENCOUNTER — Other Ambulatory Visit: Payer: Self-pay

## 2021-07-04 ENCOUNTER — Other Ambulatory Visit (HOSPITAL_COMMUNITY): Payer: BC Managed Care – PPO

## 2021-07-05 ENCOUNTER — Other Ambulatory Visit (HOSPITAL_COMMUNITY): Payer: BC Managed Care – PPO

## 2021-07-05 ENCOUNTER — Other Ambulatory Visit: Payer: Self-pay

## 2021-07-05 ENCOUNTER — Telehealth (HOSPITAL_COMMUNITY): Payer: Self-pay | Admitting: Licensed Clinical Social Worker

## 2021-07-08 ENCOUNTER — Other Ambulatory Visit (HOSPITAL_COMMUNITY): Payer: BC Managed Care – PPO

## 2021-07-08 ENCOUNTER — Other Ambulatory Visit: Payer: Self-pay

## 2021-07-09 ENCOUNTER — Other Ambulatory Visit: Payer: Self-pay

## 2021-07-09 ENCOUNTER — Other Ambulatory Visit (HOSPITAL_COMMUNITY): Payer: BC Managed Care – PPO

## 2021-07-10 ENCOUNTER — Telehealth (HOSPITAL_COMMUNITY): Payer: Self-pay | Admitting: Licensed Clinical Social Worker

## 2021-07-10 ENCOUNTER — Other Ambulatory Visit (HOSPITAL_COMMUNITY): Payer: BC Managed Care – PPO

## 2021-07-10 ENCOUNTER — Other Ambulatory Visit: Payer: Self-pay

## 2021-07-11 ENCOUNTER — Other Ambulatory Visit (HOSPITAL_COMMUNITY): Payer: BC Managed Care – PPO

## 2021-07-11 ENCOUNTER — Ambulatory Visit (HOSPITAL_COMMUNITY): Payer: BC Managed Care – PPO

## 2021-07-12 ENCOUNTER — Ambulatory Visit (HOSPITAL_COMMUNITY): Payer: BC Managed Care – PPO

## 2021-07-12 ENCOUNTER — Other Ambulatory Visit (HOSPITAL_COMMUNITY): Payer: BC Managed Care – PPO

## 2021-08-08 NOTE — Psych (Signed)
Virtual Visit via Video Note  I connected with Nicholas Wolfe on 06/24/21 at  9:00 AM EDT by a video enabled telemedicine application and verified that I am speaking with the correct person using two identifiers.  Location: Patient: patient home Provider: clinical home office   I discussed the limitations of evaluation and management by telemedicine and the availability of in person appointments. The patient expressed understanding and agreed to proceed.  I discussed the assessment and treatment plan with the patient. The patient was provided an opportunity to ask questions and all were answered. The patient agreed with the plan and demonstrated an understanding of the instructions.   The patient was advised to call back or seek an in-person evaluation if the symptoms worsen or if the condition fails to improve as anticipated.  Pt was provided 240 minutes of non-face-to-face time during this encounter.   [name], LCSW    [facility] PHP THERAPIST PROGRESS NOTE  Wandell Scullion [id number]  Session Time: 9:00 - 10:00  Participation Level: Active  Behavioral Response: CasualAlertAnxious and Depressed  Type of Therapy: Group Therapy  Treatment Goals addressed: Coping  Interventions: CBT, DBT, Supportive, and Reframing  Summary: Clinician led check-in regarding current stressors and situation. Clinician utilized active listening and empathetic response and validated patient emotions. Clinician facilitated processing group on pertinent issues.   Therapist Response: Nicholas Wolfe is a 35 y.o. male who presents with depression and anxiety symptoms. Patient arrived within time allowed and reports that he is feeling "very, very tired." Patient rates his mood at a 4 on a scale of 1-10 with 10 being great. Pt reports he had an argument with his wife over the weekend and had been ruminating since. Pt reports high stress, anxiety, and depression at an 8/10 scale. Pt reports  concerns regarding finances and housing. Pt reports poor sleep and experiencing passive SI. Pt able to process. Pt engaged in discussion.         Session Time: 10:00 - 11:00   Participation Level: Active   Behavioral Response: CasualAlertDepressed   Type of Therapy: Group Therapy   Treatment Goals addressed: Coping   Interventions: CBT, DBT, Supportive and Reframing   Summary: Cln led discussion on procrastination. Cln encouraged pt's to consider the feeling that is feeding procrastination and address it as a way of alleviating desire to procrastinate. Cln applied CBT thought challenging and DBT distress tolerance skills to aid discussion.      Therapist Response: Pt engaged in discussion and identifies the root feeling of their procrastination.         Session Time: 11:00- 12:00   Participation Level: Active   Behavioral Response: CasualAlertDepressed   Type of Therapy: Group Therapy, OT   Treatment Goals addressed: Coping   Interventions: Psychosocial skills training, Supportive   Summary: Occupational Therapy group   Therapist Response: Patient engaged in group. See OT note.           Session Time: 12:00 -1:00   Participation Level: Active   Behavioral Response: CasualAlertDepressed   Type of Therapy: Group therapy   Treatment Goals addressed: Coping   Interventions: CBT; Solution focused; Supportive; Reframing   Summary: 12:00 - 12:50: Cln led discussion on healthy aggression substitutes. Cln discussed the benefits to discharging energy and adrenaline when feeling "revved up" in emotion and the importance of balancing that discharge with safety and lack if negative consequences. Group brainstormed different ways to channel aggression in a healthy way and shared ways that have worked for  them in the past. 12:50 -1:00 Clinician led check-out. Clinician assessed for immediate needs, medication compliance and efficacy, and safety concerns   Therapist  Response: 12:50 - 1:00: Pt engaged in discussion and identifies 3 options to try.  12:50 - 1:00: At check-out, patient rates his mood at a 5 on a scale of 1-10 with 10 being great. Pt reports afternoon plans of doing chores. Pt demonstrates some progress as evidenced by participating in first group session. Patient denies SI/HI at the end of group.    Suicidal/Homicidal: Nowithout intent/plan  Plan: Pt will continue in PHP while working to decrease panic attacks, decrease SI, and increase ability to manage symptoms in a healthy manner.   Diagnosis: Severe episode of recurrent major depressive disorder, without psychotic features (HCC) [F33.2]    1. Severe episode of recurrent major depressive disorder, without psychotic features (HCC)   2. GAD (generalized anxiety disorder)       [name], LCSW 08/08/2021

## 2021-08-08 NOTE — Psych (Signed)
Virtual Visit via Video Note  I connected with Nicholas Wolfe on 06/24/21 at  9:00 AM EDT by a video enabled telemedicine application and verified that I am speaking with the correct person using two identifiers.  Location: Patient: patient home Provider: clinical home office   I discussed the limitations of evaluation and management by telemedicine and the availability of in person appointments. The patient expressed understanding and agreed to proceed.  I discussed the assessment and treatment plan with the patient. The patient was provided an opportunity to ask questions and all were answered. The patient agreed with the plan and demonstrated an understanding of the instructions.   The patient was advised to call back or seek an in-person evaluation if the symptoms worsen or if the condition fails to improve as anticipated.  Cln and pt completed treatment plan and pt stated verbal alignment with plan. Pt gave verbal consent to treatment and virtual treatment, agreement with group commitments, and permission to release information for purposes of any requested paperwork.   I provided 10  minutes of non-face-to-face time during this encounter.   [name], LCSW

## 2021-08-15 NOTE — Psych (Signed)
Virtual Visit via Video Note  I connected with Nicholas Wolfe on 06/27/21 at  9:00 AM EDT by a video enabled telemedicine application and verified that I am speaking with the correct person using two identifiers.  Location: Patient: patient home Provider: clinical home office   I discussed the limitations of evaluation and management by telemedicine and the availability of in person appointments. The patient expressed understanding and agreed to proceed.  I discussed the assessment and treatment plan with the patient. The patient was provided an opportunity to ask questions and all were answered. The patient agreed with the plan and demonstrated an understanding of the instructions.   The patient was advised to call back or seek an in-person evaluation if the symptoms worsen or if the condition fails to improve as anticipated.  Pt was provided 240 minutes of non-face-to-face time during this encounter.   [name], LCSW    [facility] PHP THERAPIST PROGRESS NOTE  Nicholas Wolfe [id number]  Session Time: 9:00 - 10:00  Participation Level: Active  Behavioral Response: CasualAlertAnxious and Depressed  Type of Therapy: Group Therapy  Treatment Goals addressed: Coping  Interventions: CBT, DBT, Supportive, and Reframing  Summary: Clinician led check-in regarding current stressors and situation. Clinician utilized active listening and empathetic response and validated patient emotions. Clinician facilitated processing group on pertinent issues.   Therapist Response: Nicholas Wolfe is a 35 y.o. male who presents with depression and anxiety symptoms. Patient arrived within time allowed and reports that he is feeling "not good." Patient rates his mood at a 3.5 on a scale of 1-10 with 10 being great. Pt reports experiencing multiple panic attacks a day over the past 2 days. Pt states he has not been sleeping for more than 30 minutes at a time. Pt reports having an argument with  his wife and struggling with rumination since. Pt identifies passive SI and denies plan and intent. Pt reports beginning new medication last night. Pt able to process. Pt engaged in discussion.         Session Time: 10:00 - 11:00   Participation Level: Active   Behavioral Response: CasualAlertDepressed   Type of Therapy: Group Therapy   Treatment Goals addressed: Coping   Interventions: CBT, DBT, Supportive and Reframing   Summary: Cln led processing group for pt's current struggles. Group members shared stressors and provided support and feedback. Cln brought in topics of boundaries, healthy relationships, and unhealthy thought processes to inform discussion.      Therapist Response: Pt able to process and provide support to group.           Session Time: 11:00 - 12:00   Participation Level: Active   Behavioral Response: CasualAlertDepressed   Type of Therapy: Group Therapy   Treatment Goals addressed: Coping   Interventions: CBT, DBT, Supportive and Reframing   Summary: Cln led discussion on forgiveness. Group members shared ways in which they struggle with forgiveness and how it has hurt them. Cln provided space for group to process. Cln encouraged pt's to consider forgiveness as a journey to free themselves from something holding them back.      Therapist Response: Pt engaged in discussion and is able to process.             Session Time: 12:00 -1:00   Participation Level: Active   Behavioral Response: CasualAlertDepressed   Type of Therapy: Group therapy   Treatment Goals addressed: Coping   Interventions: Psychosocial skills training, Supportive   Summary: 12:00 - 12:50: Occupational  Therapy group 12:50 -1:00 Clinician led check-out. Clinician assessed for immediate needs, medication compliance and efficacy, and safety concerns   Therapist Response: 12:00 - 12:50: Patient engaged in group. See OT note.  12:50 - 1:00: At check-out, patient rates his  mood at a 5 on a scale of 1-10 with 10 being great. Pt reports afternoon plans of resting. Pt demonstrates some progress as evidenced by returning to group. Patient denies SI/HI at the end of group.    Suicidal/Homicidal: Nowithout intent/plan  Plan: Pt will continue in PHP while working to decrease panic attacks, decrease SI, and increase ability to manage symptoms in a healthy manner.   Diagnosis: Severe episode of recurrent major depressive disorder, without psychotic features (HCC) [F33.2]    1. Severe episode of recurrent major depressive disorder, without psychotic features (HCC)   2. GAD (generalized anxiety disorder)       [name], LCSW 08/15/2021

## 2021-08-15 NOTE — Psych (Signed)
Virtual Visit via Video Note  I connected with Nicholas Wolfe on 06/28/21 at  9:00 AM EDT by a video enabled telemedicine application and verified that I am speaking with the correct person using two identifiers.  Location: Patient: patient home Provider: clinical home office   I discussed the limitations of evaluation and management by telemedicine and the availability of in person appointments. The patient expressed understanding and agreed to proceed.  I discussed the assessment and treatment plan with the patient. The patient was provided an opportunity to ask questions and all were answered. The patient agreed with the plan and demonstrated an understanding of the instructions.   The patient was advised to call back or seek an in-person evaluation if the symptoms worsen or if the condition fails to improve as anticipated.  Pt was provided 240 minutes of non-face-to-face time during this encounter.   [name], LCSW    [facility] PHP THERAPIST PROGRESS NOTE  Nicholas Wolfe [id number]  Session Time: 9:00 - 10:00  Participation Level: Active  Behavioral Response: CasualAlertAnxious and Depressed  Type of Therapy: Group Therapy  Treatment Goals addressed: Coping  Interventions: CBT, DBT, Supportive, and Reframing  Summary: Clinician led check-in regarding current stressors and situation. Clinician utilized active listening and empathetic response and validated patient emotions. Clinician facilitated processing group on pertinent issues.   Therapist Response: Nicholas Wolfe is a 35 y.o. male who presents with depression and anxiety symptoms. Patient arrived within time allowed and reports that he is feeling "neither here nor there." Patient rates his mood at a 5 on a scale of 1-10 with 10 being great. Pt reports he continues to sleep poorly and feels he is starting to experience the effects of sleep deprivation. Pt reports continued panic attacks and rumination. Pt  able to process. Pt engaged in discussion.         Session Time: 10:00 - 11:00   Participation Level: Active   Behavioral Response: CasualAlertDepressed   Type of Therapy: Group Therapy   Treatment Goals addressed: Coping   Interventions: CBT, DBT, Supportive and Reframing   Summary: Cln led processing group for pt's current struggles. Group members shared stressors and provided support and feedback. Cln brought in topics of boundaries, healthy relationships, and unhealthy thought processes to inform discussion.      Therapist Response: Pt able to process and provide support to group.           Session Time: 11:00 - 12:00   Participation Level: Active   Behavioral Response: CasualAlertDepressed   Type of Therapy: Group Therapy   Treatment Goals addressed: Coping   Interventions: CBT, DBT, Supportive and Reframing   Summary: Cln introduced grounding techniques as a coping strategy. Cln utilized handout "Detaching from emotional pain" from [employer]. Group reviewed grounding strategies and how they can apply them to their every day life and in which situations.      Therapist Response: Pt engaged in discussion and is able to identify ways to utilize the techniques.            Session Time: 12:00 -1:00   Participation Level: Active   Behavioral Response: CasualAlertDepressed   Type of Therapy: Group therapy   Treatment Goals addressed: Coping   Interventions: Psychosocial skills training, Supportive   Summary: 12:00 - 12:50: Occupational Therapy group 12:50 -1:00 Clinician led check-out. Clinician assessed for immediate needs, medication compliance and efficacy, and safety concerns   Therapist Response: 12:00 - 12:50: Patient engaged in group. See OT  note.  12:50 - 1:00: At check-out, patient rates his mood at a 5 on a scale of 1-10 with 10 being great. Pt reports afternoon plans of napping. Pt demonstrates some progress as evidenced by stating he  feels a "smidge better." Patient denies SI/HI at the end of group.    Suicidal/Homicidal: Nowithout intent/plan  Plan: Pt will continue in PHP while working to decrease panic attacks, decrease SI, and increase ability to manage symptoms in a healthy manner.   Diagnosis: Severe episode of recurrent major depressive disorder, without psychotic features (HCC) [F33.2]    1. Severe episode of recurrent major depressive disorder, without psychotic features (HCC)   2. GAD (generalized anxiety disorder)       [name], LCSW 08/15/2021
